# Patient Record
Sex: Female | Born: 1950 | Race: White | Hispanic: No | Marital: Married
Health system: Southern US, Community
[De-identification: ages and names within clinical notes are randomized; demographics above are authoritative.]

---

## 2017-02-06 ENCOUNTER — Emergency Department (HOSPITAL_COMMUNITY)
Admission: EM | Admit: 2017-02-06 | Discharge: 2017-02-06 | Disposition: A | Discharge status: Home / Self Care | Payer: Medicare Other | Attending: Emergency Medicine | Admitting: Emergency Medicine

## 2017-02-06 ENCOUNTER — Encounter (HOSPITAL_COMMUNITY): Payer: Self-pay | Admitting: Emergency Medicine

## 2017-02-06 ENCOUNTER — Other Ambulatory Visit: Payer: Self-pay

## 2017-02-06 ENCOUNTER — Emergency Department (HOSPITAL_COMMUNITY): Payer: Medicare Other

## 2017-02-06 DIAGNOSIS — Y9289 Other specified places as the place of occurrence of the external cause: Secondary | ICD-10-CM | POA: Insufficient documentation

## 2017-02-06 DIAGNOSIS — Y93K1 Activity, walking an animal: Secondary | ICD-10-CM | POA: Diagnosis not present

## 2017-02-06 DIAGNOSIS — S63501A Unspecified sprain of right wrist, initial encounter: Secondary | ICD-10-CM

## 2017-02-06 DIAGNOSIS — S6991XA Unspecified injury of right wrist, hand and finger(s), initial encounter: Secondary | ICD-10-CM | POA: Diagnosis present

## 2017-02-06 DIAGNOSIS — Y999 Unspecified external cause status: Secondary | ICD-10-CM | POA: Insufficient documentation

## 2017-02-06 DIAGNOSIS — W010XXA Fall on same level from slipping, tripping and stumbling without subsequent striking against object, initial encounter: Secondary | ICD-10-CM | POA: Insufficient documentation

## 2017-02-06 NOTE — ED Provider Notes (Signed)
[facility] Provider Note   CSN: [id number] Arrival date & time: 02/06/17  1457     History   Chief Complaint Chief Complaint  Patient presents with  . Wrist Injury    HPI Erika Mckay is a 66 y.o. female who presents to ED for evaluation of right wrist and hand pain after tripping outside while walking her dog.  States that she landed on both of her outstretched hands and bent knees.  She denies any head injury or loss of consciousness.  She has been ambulatory with normal gait since the incident.  She is mostly concerned about the pain in her right wrist.  Describes the pain is worse with movement and palpation.  No previous fracture, dislocations or procedures in the area.  She is not taking any medications prior to arrival.  She denies any numbness in arms or legs, vision changes, vomiting, back pain or other injuries at this time.  She reports compliance with her home medications.  [place] is here visiting her daughter.  She is established with a PCP there.  HPI  History reviewed. No pertinent past medical history.  There are no active problems to display for this patient.   OB History    No data available       Home Medications    Prior to Admission medications   Not on File    Family History No family history on file.  Social History Social History   Tobacco Use  . Smoking status: Not on file  Substance Use Topics  . Alcohol use: Not on file  . Drug use: Not on file     Allergies   Patient has no known allergies.   Review of Systems Review of Systems  Constitutional: Negative for chills and fever.  Eyes: Negative for photophobia and visual disturbance.  Respiratory: Negative for shortness of breath.   Gastrointestinal: Negative for nausea and vomiting.  Musculoskeletal: Positive for arthralgias and joint swelling. Negative for back pain and gait problem.  Skin:  Negative for rash and wound.  Neurological: Negative for dizziness, weakness, numbness and headaches.     Physical Exam Updated Vital Signs BP (!) 186/68   Pulse 98   Temp 98.2 F (36.8 C)   Resp 18   Ht 5' (1.524 m)   Wt 81.6 kg (180 lb)   SpO2 100%   BMI 35.15 kg/m   Physical Exam  Constitutional: She appears well-developed and well-nourished. No distress.  HENT:  Head: Normocephalic and atraumatic.  Eyes: Conjunctivae and EOM are normal. No scleral icterus.  Neck: Normal range of motion.  Pulmonary/Chest: Effort normal. No respiratory distress.  Musculoskeletal: Normal range of motion. She exhibits edema and tenderness. She exhibits no deformity.  Tenderness to palpation over the carpal bones of the right hand and wrist.  No specific snuffbox tenderness noted.  Able to perform active and passive range of motion of the wrist and digits.  Able to make fist.  Reports some pain with supination and pronation of the forearm.  No visible deformity, color or temperature change noted.  2+ radial pulse noted bilaterally.  Sensation intact to light touch of bilateral upper extremities. No tenderness to palpation of bilateral knees or visible deformity noted.  Neurological: She is alert.  Skin: No rash noted. She is not diaphoretic.  Psychiatric: She has a normal mood and affect.  Nursing note and vitals reviewed.    ED Treatments /  Results  Labs (all labs ordered are listed, but only abnormal results are displayed) Labs Reviewed - No data to display  EKG  EKG Interpretation None       Radiology Dg Wrist Complete Right  Result Date: 02/06/2017 CLINICAL DATA:  Fall, right wrist and hand injury. EXAM: RIGHT WRIST - COMPLETE 3+ VIEW COMPARISON:  None. FINDINGS: There is no evidence of fracture or dislocation. There is no evidence of arthropathy or other focal bone abnormality. Soft tissues are unremarkable. IMPRESSION: Negative. Electronically Signed   By: Bary RichardStan  Maynard M.D.    On: 02/06/2017 16:25   Dg Hand Complete Right  Result Date: 02/06/2017 CLINICAL DATA:  Patient tripped and fell today and has right wrist and hand pain. Hand pain is centered over the metacarpals. EXAM: RIGHT HAND - COMPLETE 3+ VIEW COMPARISON:  Right wrist series of today's date FINDINGS: The bones are subjectively adequately mineralized. The phalanges and metacarpals are intact. The joint spaces are reasonably well-maintained though early narrowing of the PIP joint of the fourth and fifth fingers fingers. The MCP and CMC joint spaces are reasonably well-maintained. IMPRESSION: No acute fracture or dislocation of the bones of the right hand. Mild degenerative interphalangeal joint changes present. Electronically Signed   By: David  Swazil[place]Jordan M.D.   On: 02/06/2017 16:26    Procedures Procedures (including critical care time)  Medications Ordered in ED Medications - No data to display   Initial Impression / Assessment and Plan / ED Course  I have reviewed the triage vital signs and the nursing notes.  Pertinent labs & imaging results that were available during my care of the patient were reviewed by me and considered in my medical decision making (see chart for details).     Patient presents to ED for evaluation of right wrist pain and swelling after tripping and falling prior to arrival.  She was walking her dog when she tripped and fell forward on both of her outstretched hands and bent knees.  She denies any head injury or loss of consciousness.  She has been ambulatory with normal gait since the incident.  She denies any knee pain or left wrist pain.  No previous fracture, dislocations or procedures in the wrist in the past.  On physical exam she is overall well-appearing.  She has no tenderness to palpation of the knees and has been ambulatory with normal gait.  There is mild edema noted around the right wrist and some vague tenderness to palpation but no visible deformity, color or  temperature change noted.  She is able to perform full active and passive range of motion of the wrist.  2+ radial pulse noted and sensation intact to light touch.  No signs of head injury or back injury.  X-rays of wrist and hand returned as negative.  Suspect that her symptoms could be due to a bruise or contusion.  Suspicion for infectious cause, DVT or other vascular cause of her symptoms.  Will give Velcro wrist splint, advised to take anti-inflammatories or Tylenol as needed for pain.  Advised to return to ED or PCPs office for for repeat x-rays if symptoms do not improve in 1-2 weeks.  Patient appears stable for discharge at this time.  Strict return precautions given.  Patient discussed with and seen by Dr. Jacqulyn BathLong.  Final Clinical Impressions(s) / ED Diagnoses   Final diagnoses:  Sprain of right wrist, initial encounter    ED Discharge Orders    None     Portions  of this note were generated with Scientist, clinical (histocompatibility and immunogenetics). Dictation errors may occur despite best attempts at proofreading.    Dietrich Pates, PA-C 02/06/17 1657    Maia Plan, MD 02/07/17 [phone]

## 2017-02-06 NOTE — ED Notes (Signed)
Patient verbalized understanding of discharge instructions and denies any further needs or questions at this time. VS stable. Patient ambulatory with steady gait.  

## 2017-02-06 NOTE — ED Triage Notes (Signed)
Pt dog tripped her and she injured her right wrist/hand. Pt daughter is orthopedic and brought her here for xray.

## 2017-02-06 NOTE — ED Notes (Signed)
Patient transported to x-ray. ?

## 2017-02-06 NOTE — Discharge Instructions (Signed)
Please read attached information regarding your condition. Wear wrist splint as directed. Follow-up in 1 week with your primary care provider or orthopedist if your symptoms do not improve for further imaging. Tylenol or ibuprofen as needed for pain. Return to ED for worsening symptoms, additional injuries or falls, head injuries, numbness in arm or leg.

## 2018-10-09 IMAGING — DX DG WRIST COMPLETE 3+V*R*
4 series · 4 of 4 positions shown · non-contrast
Comparison: None.

CLINICAL DATA: Fall, right wrist and hand injury.

EXAM:
RIGHT WRIST - COMPLETE 3+ VIEW

[wrist pa]
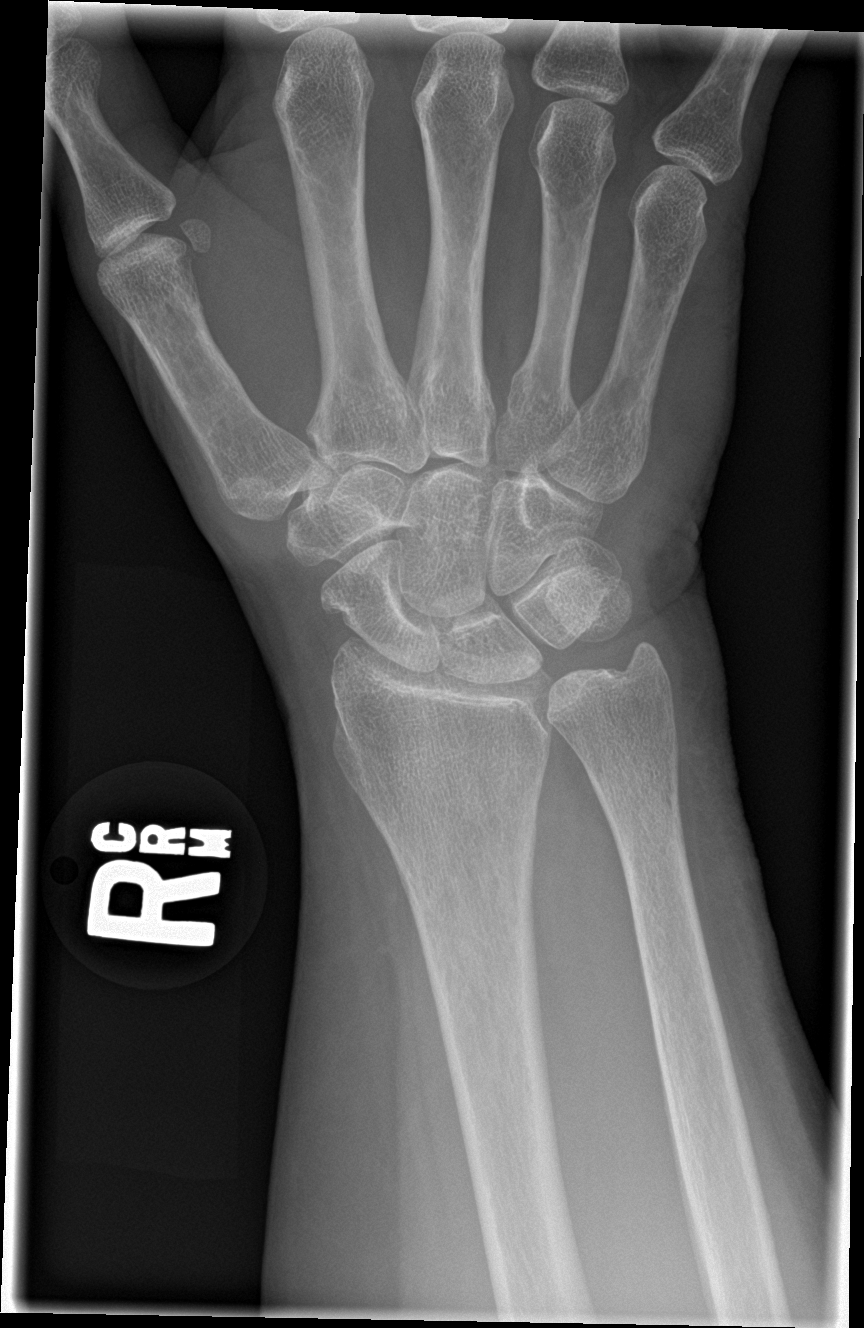

[wrist obl]
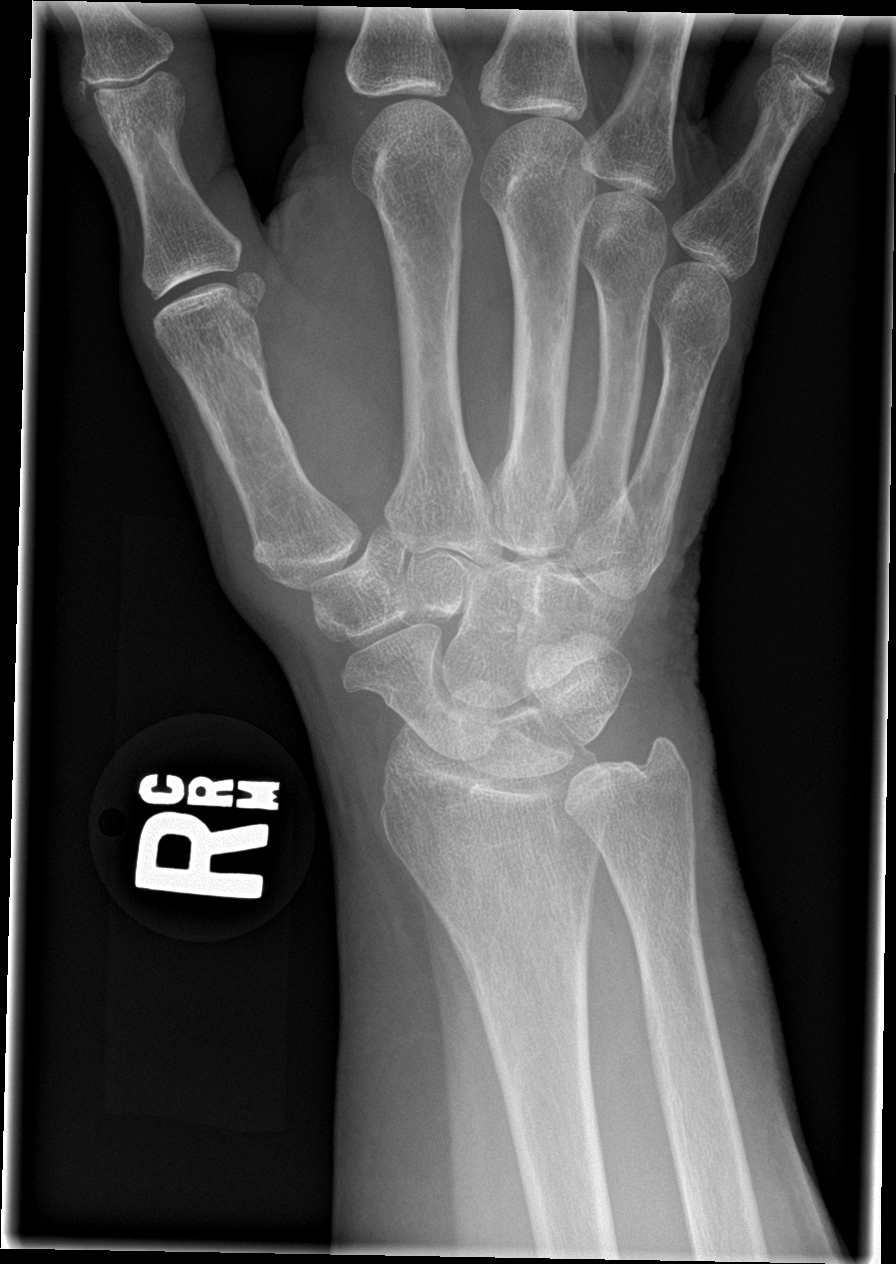

[wrist lat]
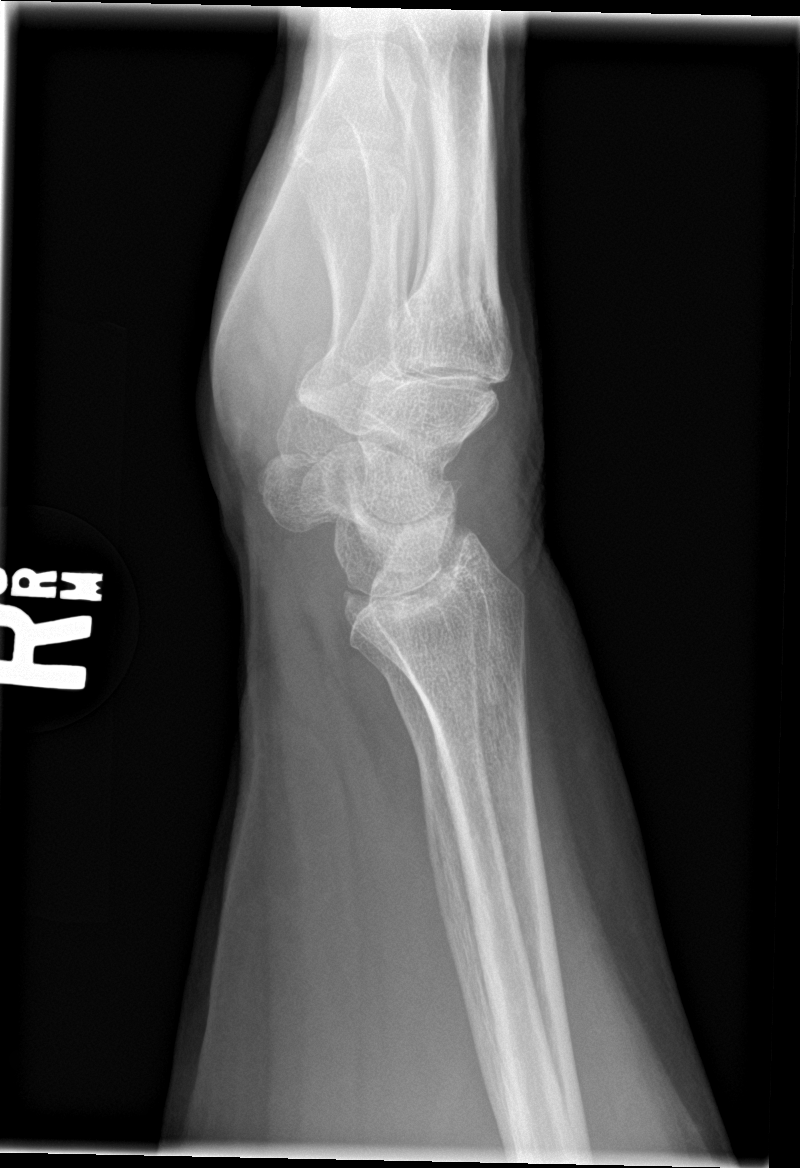

[wrist navicular]
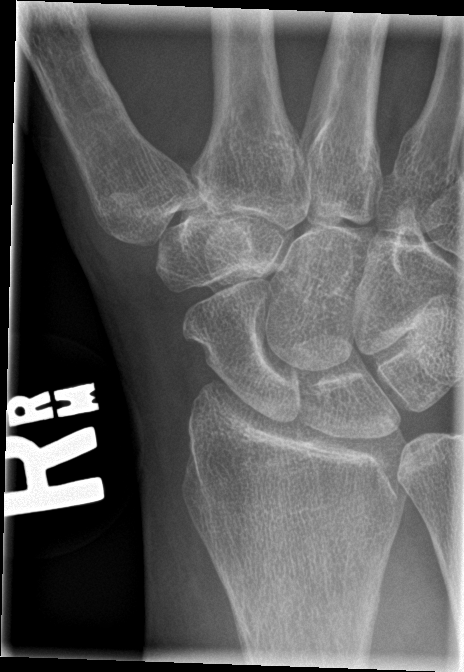

[4 of 4 positions shown; findings below may reference images not displayed]

FINDINGS: There is no evidence of fracture or dislocation. There is no
evidence of arthropathy or other focal bone abnormality. Soft
tissues are unremarkable.
IMPRESSION: Negative.

## 2021-10-13 DEATH — deceased
